# Patient Record
Sex: Female | Born: 2000
Health system: Southern US, Community
[De-identification: ages and names within clinical notes are randomized; demographics above are authoritative.]

---
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
## 2018-02-23 DIAGNOSIS — Z91038 Other insect allergy status: Secondary | ICD-10-CM | POA: Diagnosis not present

## 2018-03-31 DIAGNOSIS — Z0000 Encounter for general adult medical examination without abnormal findings: Secondary | ICD-10-CM | POA: Diagnosis not present

## 2018-03-31 DIAGNOSIS — Z0100 Encounter for examination of eyes and vision without abnormal findings: Secondary | ICD-10-CM | POA: Diagnosis not present

## 2018-03-31 DIAGNOSIS — Z0110 Encounter for examination of ears and hearing without abnormal findings: Secondary | ICD-10-CM | POA: Diagnosis not present

## 2019-02-13 ENCOUNTER — Encounter (HOSPITAL_BASED_OUTPATIENT_CLINIC_OR_DEPARTMENT_OTHER): Payer: Self-pay | Admitting: *Deleted

## 2019-02-13 ENCOUNTER — Emergency Department (HOSPITAL_BASED_OUTPATIENT_CLINIC_OR_DEPARTMENT_OTHER)
Admission: EM | Admit: 2019-02-13 | Discharge: 2019-02-13 | Disposition: A | Discharge status: Home / Self Care | Payer: 59 | Attending: Emergency Medicine | Admitting: Emergency Medicine

## 2019-02-13 ENCOUNTER — Other Ambulatory Visit: Payer: Self-pay

## 2019-02-13 DIAGNOSIS — O99891 Other specified diseases and conditions complicating pregnancy: Secondary | ICD-10-CM | POA: Insufficient documentation

## 2019-02-13 DIAGNOSIS — F17210 Nicotine dependence, cigarettes, uncomplicated: Secondary | ICD-10-CM | POA: Insufficient documentation

## 2019-02-13 DIAGNOSIS — R1011 Right upper quadrant pain: Secondary | ICD-10-CM | POA: Diagnosis not present

## 2019-02-13 DIAGNOSIS — R1013 Epigastric pain: Secondary | ICD-10-CM | POA: Insufficient documentation

## 2019-02-13 DIAGNOSIS — R110 Nausea: Secondary | ICD-10-CM | POA: Insufficient documentation

## 2019-02-13 DIAGNOSIS — Z3A01 Less than 8 weeks gestation of pregnancy: Secondary | ICD-10-CM | POA: Insufficient documentation

## 2019-02-13 DIAGNOSIS — R1012 Left upper quadrant pain: Secondary | ICD-10-CM | POA: Diagnosis not present

## 2019-02-13 LAB — URINALYSIS, ROUTINE W REFLEX MICROSCOPIC
Bilirubin Urine: NEGATIVE
Glucose, UA: NEGATIVE mg/dL
Hgb urine dipstick: NEGATIVE
Ketones, ur: NEGATIVE mg/dL
Leukocytes,Ua: NEGATIVE
Nitrite: NEGATIVE
Protein, ur: NEGATIVE mg/dL
Specific Gravity, Urine: 1.02 (ref 1.005–1.030)
pH: 6 (ref 5.0–8.0)

## 2019-02-13 LAB — COMPREHENSIVE METABOLIC PANEL
ALT: 26 U/L (ref 0–44)
AST: 23 U/L (ref 15–41)
Albumin: 4.3 g/dL (ref 3.5–5.0)
Alkaline Phosphatase: 49 U/L (ref 38–126)
Anion gap: 7 (ref 5–15)
BUN: 9 mg/dL (ref 6–20)
CO2: 26 mmol/L (ref 22–32)
Calcium: 9.7 mg/dL (ref 8.9–10.3)
Chloride: 102 mmol/L (ref 98–111)
Creatinine, Ser: 0.53 mg/dL (ref 0.44–1.00)
GFR calc Af Amer: >60 mL/min (ref >60)
GFR calc non Af Amer: >60 mL/min (ref >60)
Glucose, Bld: 88 mg/dL (ref 70–99)
Potassium: 4.1 mmol/L (ref 3.5–5.1)
Sodium: 135 mmol/L (ref 135–145)
Total Bilirubin: 1.1 mg/dL (ref 0.3–1.2)
Total Protein: 7.6 g/dL (ref 6.5–8.1)

## 2019-02-13 LAB — CBC WITH DIFFERENTIAL/PLATELET
Abs Immature Granulocytes: 0.03 10*3/uL (ref 0.00–0.07)
Basophils Absolute: 0 10*3/uL (ref 0.0–0.1)
Basophils Relative: 1 %
Eosinophils Absolute: 0.2 10*3/uL (ref 0.0–0.5)
Eosinophils Relative: 3 %
HCT: 38.1 % (ref 36.0–46.0)
Hemoglobin: 12.4 g/dL (ref 12.0–15.0)
Immature Granulocytes: 1 %
Lymphocytes Relative: 29 %
Lymphs Abs: 1.8 10*3/uL (ref 0.7–4.0)
MCH: 31.2 pg (ref 26.0–34.0)
MCHC: 32.5 g/dL (ref 30.0–36.0)
MCV: 95.7 fL (ref 80.0–100.0)
Monocytes Absolute: 0.5 10*3/uL (ref 0.1–1.0)
Monocytes Relative: 9 %
Neutro Abs: 3.6 10*3/uL (ref 1.7–7.7)
Neutrophils Relative %: 57 %
Platelets: 218 10*3/uL (ref 150–400)
RBC: 3.98 MIL/uL (ref 3.87–5.11)
RDW: 12.7 % (ref 11.5–15.5)
WBC: 6.1 10*3/uL (ref 4.0–10.5)
nRBC: 0 % (ref 0.0–0.2)

## 2019-02-13 LAB — LIPASE, BLOOD: Lipase: 20 U/L (ref 11–51)

## 2019-02-13 LAB — PREGNANCY, URINE: Preg Test, Ur: POSITIVE — AB

## 2019-02-13 MED ORDER — ONDANSETRON 4 MG PO TBDP
ORAL_TABLET | ORAL | Status: AC
  Filled 2019-02-13: qty 1

## 2019-02-13 MED ORDER — ONDANSETRON 4 MG PO TBDP
4.0000 mg | ORAL_TABLET | Freq: Once | ORAL | Status: AC
  Administered 2019-02-13: 11:00:00 4 mg via ORAL

## 2019-02-13 NOTE — ED Notes (Signed)
ED Provider at bedside. 

## 2019-02-13 NOTE — Discharge Instructions (Addendum)
You can take a 1/2 tab of unisom (doxylamine) at bedtime with 50-100mg  of vitamin B6 (pyroxidine). This may help your morning sickness/nausea. It is important you drink plenty of water throughout the day. Eat smaller more frequent meals to help with nausea. Begin taking a prenatal vitamin daily. You can also find this over-the-counter. Establish OB care.  Report to the Maternity Admissions Unit (MAU) at [facility] if you develop lower abdominal pain, with vaginal bleeding, uncontrollable vomiting, or other concerning symptoms.

## 2019-02-13 NOTE — ED Triage Notes (Signed)
Abdominal pain for a week.  Last menstrual period was Christmas.  Denies nausea.

## 2019-02-13 NOTE — ED Provider Notes (Signed)
[facility] EMERGENCY DEPARTMENT Provider Note   CSN: [id number] Arrival date & time: 02/13/19  1032     History Chief Complaint  Patient presents with  . Abdominal Pain    Sonya Ewing is a 19 y.o. female, presenting to the ED with complaint of b/l upper abd pain that began 1 week ago. Pain is described as intermittent and sharp. Pain tends to occur prior to having a BM, which have been loose. She reports frequent nausea, no vomiting. She has been able to tolerate fluids and meals later in the day. Symptoms seem worse in the morning. Denies vaginal d/c, vaginal bleeding, F/C, urinary sx. She is sexually active with female partners, has not been using protection recently. LMP Jan 06, 2019. Menstrual cycle is normally regular, considers this menstrual period late. No medications tried for symptoms.   The history is provided by the patient.       History reviewed. No pertinent past medical history.  There are no problems to display for this patient.   History reviewed. No pertinent surgical history.   OB History    Gravida  1   Para      Term      Preterm      AB      Living        SAB      TAB      Ectopic      Multiple      Live Births              No family history on file.  Social History   Tobacco Use  . Smoking status: Current Every Day Smoker    Types: Cigars  . Smokeless tobacco: Never Used  . Tobacco comment: Black and milds  Substance Use Topics  . Alcohol use: No  . Drug use: Yes    Types: Marijuana    Comment: month ago    Home Medications Prior to Admission medications   Not on File    Allergies    Patient has no known allergies.  Review of Systems   Review of Systems  Gastrointestinal: Positive for abdominal pain, diarrhea and nausea. Negative for vomiting.  All other systems reviewed and are negative.   Physical Exam Updated Vital Signs BP 113/76 (BP Location: Left Arm)   Pulse 73   Temp 98.7 F (37.1 C)  (Oral)   Resp 16   Ht 5\' 7"  (1.702 m)   Wt 52.6 kg   LMP 01/06/2019   SpO2 100%   BMI 18.17 kg/m   Physical Exam Vitals and nursing note reviewed.  Constitutional:      General: She is not in acute distress.    Appearance: She is well-developed. She is not ill-appearing.  HENT:     Head: Normocephalic and atraumatic.  Eyes:     Conjunctiva/sclera: Conjunctivae normal.  Cardiovascular:     Rate and Rhythm: Normal rate and regular rhythm.  Pulmonary:     Effort: Pulmonary effort is normal. No respiratory distress.     Breath sounds: Normal breath sounds.  Abdominal:     General: Abdomen is flat. Bowel sounds are normal.     Palpations: Abdomen is soft.     Tenderness: There is abdominal tenderness in the right upper quadrant, epigastric area and left upper quadrant. There is no guarding or rebound.  Skin:    General: Skin is warm.  Neurological:     Mental Status: She is alert.  Psychiatric:        Behavior: Behavior normal.     ED Results / Procedures / Treatments   Labs (all labs ordered are listed, but only abnormal results are displayed) Labs Reviewed  URINALYSIS, ROUTINE W REFLEX MICROSCOPIC - Abnormal; Notable for the following components:      Result Value   APPearance CLOUDY (*)    All other components within normal limits  PREGNANCY, URINE - Abnormal; Notable for the following components:   Preg Test, Ur POSITIVE (*)    All other components within normal limits  COMPREHENSIVE METABOLIC PANEL  LIPASE, BLOOD  CBC WITH DIFFERENTIAL/PLATELET    EKG None  Radiology No results found.  Procedures Procedures (including critical care time)  Medications Ordered in ED Medications  ondansetron (ZOFRAN-ODT) disintegrating tablet 4 mg (4 mg Oral Given 02/13/19 1057)    ED Course  I have reviewed the triage vital signs and the nursing notes.  Pertinent labs & imaging results that were available during my care of the patient were reviewed by me and considered  in my medical decision making (see chart for details).    MDM Rules/Calculators/A&P                      Patient presenting with 1 week of intermittent upper abdominal pain with associated nausea and diarrhea.  She is also late for her menstrual period, LMP 01/06/2019.  She is sexually active with female partners without protection though is not having any pelvic complaints today.  Including, no vaginal bleeding, vaginal discharge, or pelvic pain.  Her abdomen is soft with some mild tenderness in the upper quadrants though no guarding or rebound.  Vital signs are normal.  She is tolerating p.o. fluids and foods.  Urine pregnancy test is positive.  Lab work is otherwise unremarkable.  Presentation is not consistent with ectopic pregnancy.  Do not believe emergent ultrasound is indicated at this time.  Discussed symptomatic management of nausea to include Unisom and B6, instructed she begin taking a prenatal vitamin.  We will also provide referral to establish OB care.  Patient is agreeable with this plan.  Will discharge with symptomatic management, strict return precautions.  Patient verbalized understanding agrees with care plan for discharge.  Patient discussed with Dr. Criss Alvine, who agrees with work-up and care plan.  Discussed results, findings, treatment and follow up. Patient advised of return precautions. Patient verbalized understanding and agreed with plan.  Final Clinical Impression(s) / ED Diagnoses Final diagnoses:  Less than [redacted] weeks gestation of pregnancy    Rx / DC Orders ED Discharge Orders    None       Markus Casten, [place], PA-C 02/13/19 1233    Pricilla Loveless, MD 02/15/19 [phone]

## 2019-02-18 ENCOUNTER — Encounter (HOSPITAL_BASED_OUTPATIENT_CLINIC_OR_DEPARTMENT_OTHER): Payer: Self-pay | Admitting: Emergency Medicine

## 2019-02-18 ENCOUNTER — Emergency Department (HOSPITAL_BASED_OUTPATIENT_CLINIC_OR_DEPARTMENT_OTHER): Payer: 59

## 2019-02-18 ENCOUNTER — Emergency Department (HOSPITAL_BASED_OUTPATIENT_CLINIC_OR_DEPARTMENT_OTHER)
Admission: EM | Admit: 2019-02-18 | Discharge: 2019-02-18 | Disposition: A | Discharge status: Home / Self Care | Payer: 59 | Attending: Emergency Medicine | Admitting: Emergency Medicine

## 2019-02-18 ENCOUNTER — Other Ambulatory Visit: Payer: Self-pay

## 2019-02-18 DIAGNOSIS — F17290 Nicotine dependence, other tobacco product, uncomplicated: Secondary | ICD-10-CM | POA: Diagnosis not present

## 2019-02-18 DIAGNOSIS — O99330 Smoking (tobacco) complicating pregnancy, unspecified trimester: Secondary | ICD-10-CM | POA: Insufficient documentation

## 2019-02-18 DIAGNOSIS — R1030 Lower abdominal pain, unspecified: Secondary | ICD-10-CM | POA: Insufficient documentation

## 2019-02-18 DIAGNOSIS — R1013 Epigastric pain: Secondary | ICD-10-CM | POA: Diagnosis not present

## 2019-02-18 DIAGNOSIS — O99891 Other specified diseases and conditions complicating pregnancy: Secondary | ICD-10-CM | POA: Diagnosis not present

## 2019-02-18 DIAGNOSIS — Z3A01 Less than 8 weeks gestation of pregnancy: Secondary | ICD-10-CM | POA: Diagnosis not present

## 2019-02-18 DIAGNOSIS — R109 Unspecified abdominal pain: Secondary | ICD-10-CM | POA: Diagnosis present

## 2019-02-18 LAB — CBC WITH DIFFERENTIAL/PLATELET
Abs Immature Granulocytes: 0.02 10*3/uL (ref 0.00–0.07)
Basophils Absolute: 0.1 10*3/uL (ref 0.0–0.1)
Basophils Relative: 1 %
Eosinophils Absolute: 0.2 10*3/uL (ref 0.0–0.5)
Eosinophils Relative: 3 %
HCT: 35.6 % — ABNORMAL LOW (ref 36.0–46.0)
Hemoglobin: 11.5 g/dL — ABNORMAL LOW (ref 12.0–15.0)
Immature Granulocytes: 0 %
Lymphocytes Relative: 35 %
Lymphs Abs: 2.1 10*3/uL (ref 0.7–4.0)
MCH: 31.2 pg (ref 26.0–34.0)
MCHC: 32.3 g/dL (ref 30.0–36.0)
MCV: 96.5 fL (ref 80.0–100.0)
Monocytes Absolute: 0.7 10*3/uL (ref 0.1–1.0)
Monocytes Relative: 11 %
Neutro Abs: 3 10*3/uL (ref 1.7–7.7)
Neutrophils Relative %: 50 %
Platelets: 217 10*3/uL (ref 150–400)
RBC: 3.69 MIL/uL — ABNORMAL LOW (ref 3.87–5.11)
RDW: 12.6 % (ref 11.5–15.5)
WBC: 6 10*3/uL (ref 4.0–10.5)
nRBC: 0 % (ref 0.0–0.2)

## 2019-02-18 LAB — HCG, QUANTITATIVE, PREGNANCY: hCG, Beta Chain, Quant, S: 83098 m[IU]/mL — ABNORMAL HIGH (ref <5)

## 2019-02-18 LAB — URINALYSIS, ROUTINE W REFLEX MICROSCOPIC
Bilirubin Urine: NEGATIVE
Glucose, UA: NEGATIVE mg/dL
Hgb urine dipstick: NEGATIVE
Ketones, ur: NEGATIVE mg/dL
Leukocytes,Ua: NEGATIVE
Nitrite: NEGATIVE
Protein, ur: NEGATIVE mg/dL
Specific Gravity, Urine: 1.025 (ref 1.005–1.030)
pH: 6.5 (ref 5.0–8.0)

## 2019-02-18 LAB — COMPREHENSIVE METABOLIC PANEL
ALT: 40 U/L (ref 0–44)
AST: 34 U/L (ref 15–41)
Albumin: 4 g/dL (ref 3.5–5.0)
Alkaline Phosphatase: 47 U/L (ref 38–126)
Anion gap: 9 (ref 5–15)
BUN: 9 mg/dL (ref 6–20)
CO2: 22 mmol/L (ref 22–32)
Calcium: 8.8 mg/dL — ABNORMAL LOW (ref 8.9–10.3)
Chloride: 102 mmol/L (ref 98–111)
Creatinine, Ser: 0.55 mg/dL (ref 0.44–1.00)
GFR calc Af Amer: >60 mL/min (ref >60)
GFR calc non Af Amer: >60 mL/min (ref >60)
Glucose, Bld: 88 mg/dL (ref 70–99)
Potassium: 3.8 mmol/L (ref 3.5–5.1)
Sodium: 133 mmol/L — ABNORMAL LOW (ref 135–145)
Total Bilirubin: 0.9 mg/dL (ref 0.3–1.2)
Total Protein: 7.2 g/dL (ref 6.5–8.1)

## 2019-02-18 MED ORDER — METOCLOPRAMIDE HCL 5 MG/ML IJ SOLN
10.0000 mg | Freq: Once | INTRAMUSCULAR | Status: AC
  Administered 2019-02-18: 10 mg via INTRAVENOUS
  Filled 2019-02-18: qty 2

## 2019-02-18 MED ORDER — DIPHENHYDRAMINE HCL 50 MG/ML IJ SOLN
12.5000 mg | Freq: Once | INTRAMUSCULAR | Status: AC
  Administered 2019-02-18: 12.5 mg via INTRAVENOUS
  Filled 2019-02-18: qty 1

## 2019-02-18 MED ORDER — ACETAMINOPHEN 325 MG PO TABS
650.0000 mg | ORAL_TABLET | Freq: Once | ORAL | Status: AC
  Administered 2019-02-18: 18:00:00 650 mg via ORAL
  Filled 2019-02-18: qty 2

## 2019-02-18 NOTE — ED Notes (Signed)
Patient transported to Ultrasound 

## 2019-02-18 NOTE — ED Triage Notes (Signed)
Pt reports abdominal pain that pt reports started this morning. Also reports N/V. Pt report pregnant, unknown last menses- Christmas?

## 2019-02-18 NOTE — Discharge Instructions (Signed)
Your lab work looks okay today.   You had an ultrasound of your pelvis which is a pregnancy in the uterus at approximately 6 weeks and 5 days long.  You had a ultrasound of your right upper quadrant which did not show any gallstones but does show a small amount of fluid in your abdomen or possibly around your heart.  You need to call your doctor on Monday and have them review the results of your ultrasounds.  You could need further evaluation of your heart.  In the meantime, if you develop any chest pain, shortness of breath, lightheadedness, or passing out you need to return immediately to the emergency department.

## 2019-02-18 NOTE — ED Notes (Signed)
PT states " I am hungry and I want my IV out and I want to go home". Josh PA informed pt wants to leave. This RN informed pt he would be in as soon as possible, but could not give a time frame. Pt getting dressed and stated " When my boyfriend get here I am leaving".

## 2019-02-18 NOTE — ED Provider Notes (Signed)
[facility] EMERGENCY DEPARTMENT Provider Note   CSN: [id number] Arrival date & time: 02/18/19  1552     History Chief Complaint  Patient presents with  . Abdominal Pain    Sonya Ewing is a 19 y.o. female G1P0, presenting to the ED for abdominal pain.  Patient was recently evaluated on 02/13/2019 for epigastric abdominal pain and missed menstrual period.  Patient was found to be pregnant via pregnancy test.  Her abdominal pain was all epigastric and she had normal lab work.  Discharged with symptomatic management, OB referral, and return precautions.  She returns today with worsening pain she states is now going towards her lower abdomen and towards the right.  She reports that sharp in nature with intermittent cramping radiating to her low back.  She reports persistent nausea without vomiting.  No fevers or chills.  Denies vaginal bleeding or discharge, denies urinary symptoms.  She has not treated her symptoms with any medications.  The history is provided by the patient and medical records.       History reviewed. No pertinent past medical history.  There are no problems to display for this patient.   History reviewed. No pertinent surgical history.   OB History    Gravida  1   Para      Term      Preterm      AB      Living        SAB      TAB      Ectopic      Multiple      Live Births              History reviewed. No pertinent family history.  Social History   Tobacco Use  . Smoking status: Current Every Day Smoker    Types: Cigars  . Smokeless tobacco: Never Used  . Tobacco comment: Black and milds  Substance Use Topics  . Alcohol use: No  . Drug use: Yes    Types: Marijuana    Comment: month ago    Home Medications Prior to Admission medications   Not on File    Allergies    Patient has no known allergies.  Review of Systems   Review of Systems  All other systems reviewed and are negative.   Physical Exam  Updated Vital Signs BP 112/85 (BP Location: Right Arm)   Pulse 69   Temp 98.2 F (36.8 C) (Oral)   Resp (!) 24   LMP 01/03/2019   SpO2 100%   Physical Exam Vitals and nursing note reviewed.  Constitutional:      General: She is not in acute distress.    Appearance: She is well-developed. She is not ill-appearing.  HENT:     Head: Normocephalic and atraumatic.  Eyes:     Conjunctiva/sclera: Conjunctivae normal.  Cardiovascular:     Rate and Rhythm: Normal rate and regular rhythm.  Pulmonary:     Effort: Pulmonary effort is normal. No respiratory distress.     Breath sounds: Normal breath sounds.  Abdominal:     General: Abdomen is flat. Bowel sounds are normal.     Palpations: Abdomen is soft.     Tenderness: There is abdominal tenderness in the right lower quadrant, periumbilical area and suprapubic area. There is no guarding or rebound.  Skin:    General: Skin is warm.  Neurological:     Mental Status: She is alert.  Psychiatric:  Behavior: Behavior normal.     ED Results / Procedures / Treatments   Labs (all labs ordered are listed, but only abnormal results are displayed) Labs Reviewed  URINALYSIS, ROUTINE W REFLEX MICROSCOPIC - Abnormal; Notable for the following components:      Result Value   APPearance HAZY (*)    All other components within normal limits  COMPREHENSIVE METABOLIC PANEL - Abnormal; Notable for the following components:   Sodium 133 (*)    Calcium 8.8 (*)    All other components within normal limits  CBC WITH DIFFERENTIAL/PLATELET - Abnormal; Notable for the following components:   RBC 3.69 (*)    Hemoglobin 11.5 (*)    HCT 35.6 (*)    All other components within normal limits  HCG, QUANTITATIVE, PREGNANCY - Abnormal; Notable for the following components:   hCG, Beta Chain, Quant, S 83,098 (*)    All other components within normal limits    EKG None  Radiology US OB Comp < 14 Wks  Result Date: 02/18/2019 CLINICAL DATA:   Epigastric, right lower quadrant pain EXAM: OBSTETRIC <14 WK [place] AND TRANSVAGINAL OB US TECHNIQUE: Both transabdominal and transvaginal ultrasound examinations were performed for complete evaluation of the gestation as well as the maternal uterus, adnexal regions, and pelvic cul-de-sac. Transvaginal technique was performed to assess early pregnancy. COMPARISON:  None. FINDINGS: Intrauterine gestational sac: Single Yolk sac:  Visualized Embryo:  Visualized Cardiac Activity: Visualized Heart Rate: 120 bpm MSD:   mm    w     d CRL:  7.9 mm   6 w   5 d                  [place] EDC: 10/09/2019 Subchorionic hemorrhage:  None visualized. Maternal uterus/adnexae: No adnexal mass or free fluid. IMPRESSION: Six week 5 day intrauterine pregnancy. Fetal heart rate 120 beats per minute. No acute maternal findings. Electronically Signed   By: Charlett Nose M.D.   On: 02/18/2019 17:36   US OB Transvaginal  Result Date: 02/18/2019 CLINICAL DATA:  Epigastric, right lower quadrant pain EXAM: OBSTETRIC <14 WK [place] AND TRANSVAGINAL OB US TECHNIQUE: Both transabdominal and transvaginal ultrasound examinations were performed for complete evaluation of the gestation as well as the maternal uterus, adnexal regions, and pelvic cul-de-sac. Transvaginal technique was performed to assess early pregnancy. COMPARISON:  None. FINDINGS: Intrauterine gestational sac: Single Yolk sac:  Visualized Embryo:  Visualized Cardiac Activity: Visualized Heart Rate: 120 bpm MSD:   mm    w     d CRL:  7.9 mm   6 w   5 d                  [place] EDC: 10/09/2019 Subchorionic hemorrhage:  None visualized. Maternal uterus/adnexae: No adnexal mass or free fluid. IMPRESSION: Six week 5 day intrauterine pregnancy. Fetal heart rate 120 beats per minute. No acute maternal findings. Electronically Signed   By: Charlett Nose M.D.   On: 02/18/2019 17:36    Procedures Procedures (including critical care time)  Medications Ordered in ED Medications  acetaminophen (TYLENOL)  tablet 650 mg (650 mg Oral Given 02/18/19 1751)  metoCLOPramide (REGLAN) injection 10 mg (10 mg Intravenous Given 02/18/19 1634)  diphenhydrAMINE (BENADRYL) injection 12.5 mg (12.5 mg Intravenous Given 02/18/19 1635)    ED Course  I have reviewed the triage vital signs and the nursing notes.  Pertinent labs & imaging results that were available during my care of the patient were reviewed by  me and considered in my medical decision making (see chart for details).  Clinical Course as of Feb 17 1750  Sat Feb 18, 2019  1751 Repeat abdominal exam now with RUQ tenderness as well. Shared decision making regarding symptomatic managemnt vs RUQ u/s, pt would prefer u/s. Care assumed at shift change by Daisy Lazar.   [JR]    Clinical Course User Index [JR] Yulanda Diggs, [place], PA-C   MDM Rules/Calculators/A&P                      Patient is G1, P0, with recent positive pregnancy test on 02/13/2019 in the ED, LMP 01/06/2019.  Patient presenting with worsening abdominal pain, initially was in the epigastric region during visit 5 days ago, however reports is moving lower in her abdomen may be favoring the right side.  She reports persistent nausea.  Denies vaginal bleeding or discharge.  Denies urinary symptoms.  Abdomen is soft with some tenderness the lower abdomen and periumbilical region.  No guarding or rebound.  Vital signs are stable.  Will repeat lab work, obtain quantitative hCG, ABO Rh, and pelvic ultrasound for further evaluation.  Pelvic U/S showing live intrauterine pregnancy estimated gestational age of [redacted] weeks and 5 days.  On repeat abdominal exam, she now has right upper quadrant tenderness and thinks her symptoms may be worse after meals as well.  Discussed reassuring lab work including normal LFTs.  Discussed symptomatic management and outpatient follow-up versus ultrasound of her gallbladder here in the ED.  Patient would prefer ultrasound.  Imaging ordered.  Patient care assumed at shift change  by PA Geiple.  If negative, recommend symptomatic management including antacids, p.o. Tylenol for pain, PCP follow-up.  Final Clinical Impression(s) / ED Diagnoses Final diagnoses:  Epigastric abdominal pain    Rx / DC Orders ED Discharge Orders    None       Eowyn Tabone, [place], PA-C 02/18/19 1753    Rolan Bucco, MD 02/18/19 1818

## 2019-02-18 NOTE — ED Provider Notes (Signed)
8:46 PM signout from [employer] at shift change.  Patient who is in first trimester pregnancy presents with continued abdominal pain.  She has PCP and planned OB/GYN follow-up.  She had a pelvic exam which did not have any evidence of an infection.  She had a pelvic ultrasound which shows intrauterine pregnancy without any complications.  Patient then had some right upper quadrant pain and ultrasound was ordered to evaluate for gallstones.  No gallstones.  There is a thin stripe of fluid, question intra-abdominal versus pericardial.   I went to see the patient.  She is very anxious and trying to leave because she is hungry.  Patient is stressed.  She was actually walking out the door when I approached the room.  Patient returned to the room and we had a discussion about her results today.  Discussed that we do not see any signs of gallstones or other problems with the gallbladder which would explain her pain.  We discussed that she has some fluid and we cannot tell at this point whether is in the abdomen or around the heart.  Patient denies any significant shortness of breath or chest pain.  She has had no recent coronavirus or other viral illnesses.  Patient is adamant about leaving.  I asked her to call her primary care doctor and discussed the results of her ultrasound on Monday (today is Saturday).  She states that she is willing to do this.  I discussed that if in the meantime she develops any chest pain, shortness of breath, fevers, cough or worsening of her current symptoms that she should return the emergency department.  She agrees with plan.  Discussed that I cannot currently tell how significant the fluid is but the amount is currently small.   I took the time to print out discharge instructions for the patient which she accepted.  Left in no distress, no shortness of breath or any respiratory distress.  BP 117/67 (BP Location: Left Arm)   Pulse 67   Temp 98.2 F (36.8 C) (Oral)   Resp 16    LMP 01/03/2019   SpO2 100%     Renne Crigler, PA-C 02/18/19 2109    Rolan Bucco, MD 02/18/19 2205

## 2019-06-20 IMAGING — CT CT ABD-PELV W/ CM
2 of 4 series · 16 of 46 positions shown, 18 images · IV contrast (iopamidol)
Comparison: None.

CLINICAL DATA: Right lower quadrant abdominal pain for 1 day.

EXAM:
CT ABDOMEN AND PELVIS WITH CONTRAST
TECHNIQUE: Multidetector CT imaging of the abdomen and pelvis was performed
using the standard protocol following bolus administration of
intravenous contrast.
CONTRAST:  100mL [id number] IOPAMIDOL ([id number]) INJECTION 61%

[Series 2: axial st · axial · 0.62mm/px · z∈[-454,-59]mm · 13 of 87 slices shown, 15 images]
[im 4/87  soft-tissue]
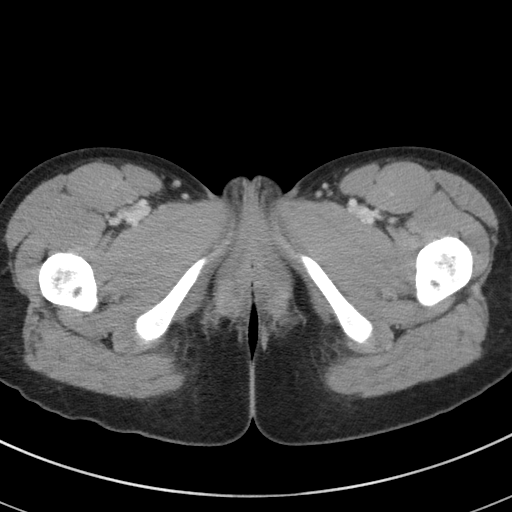
[im 4/87  bone]
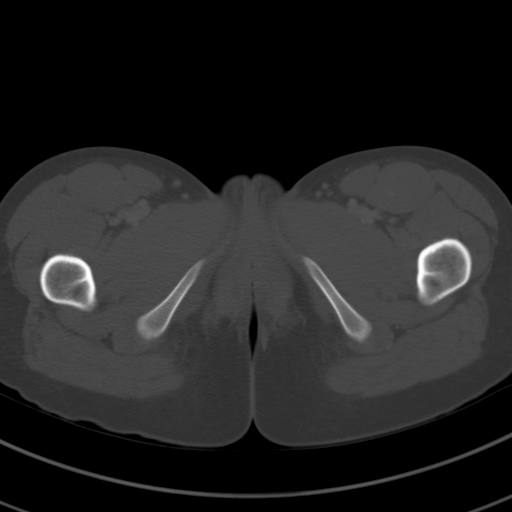
[im 10/87  soft-tissue]
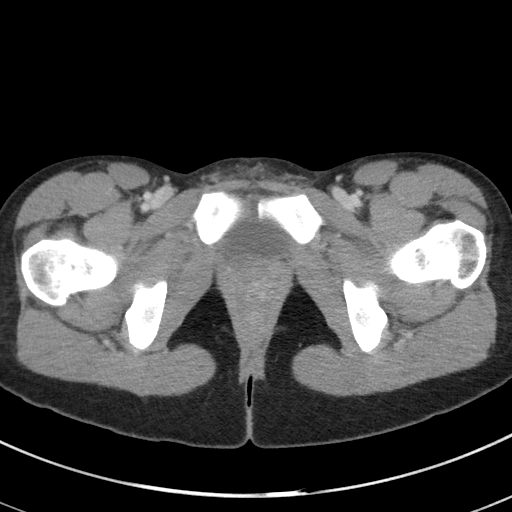
[im 17/87  soft-tissue]
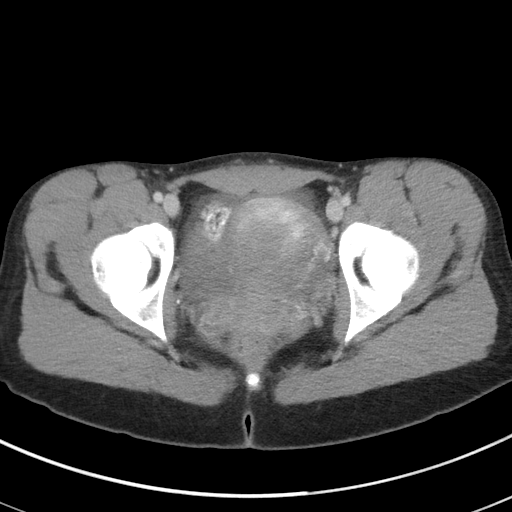
[im 24/87  soft-tissue]
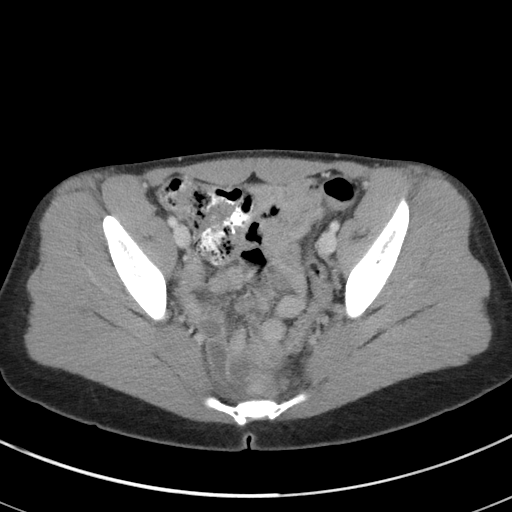
[im 30/87  soft-tissue]
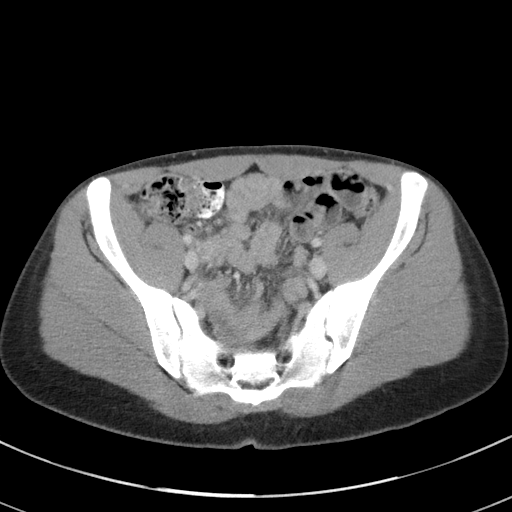
[im 37/87  soft-tissue]
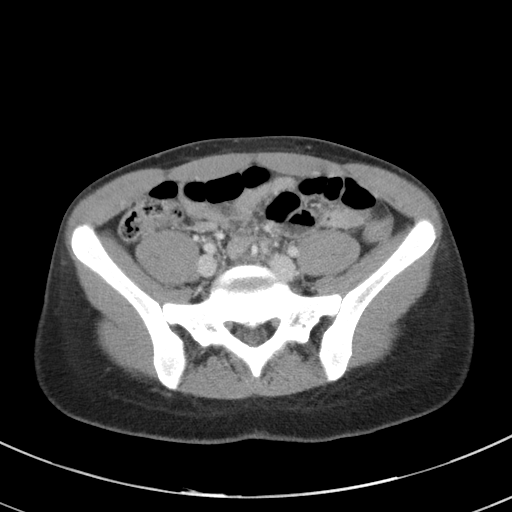
[im 44/87  soft-tissue]
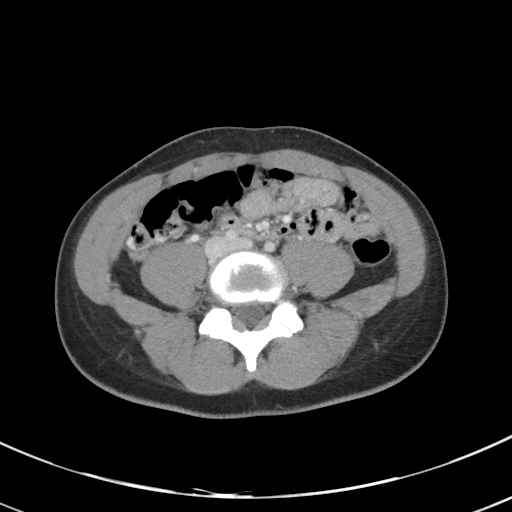
[im 50/87  soft-tissue]
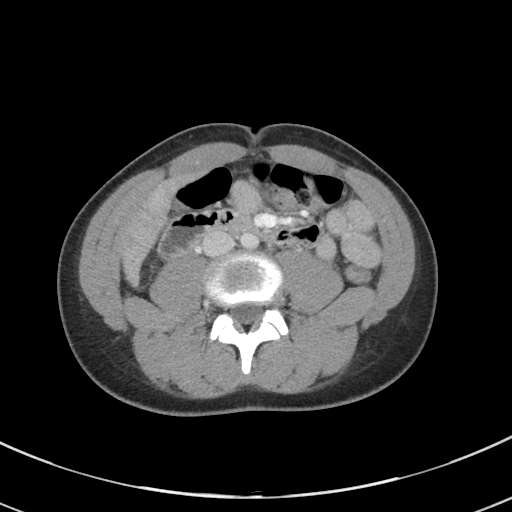
[im 57/87  soft-tissue]
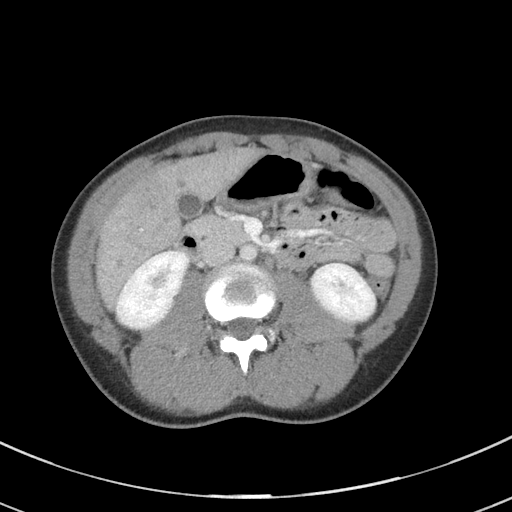
[im 57/87  bone]
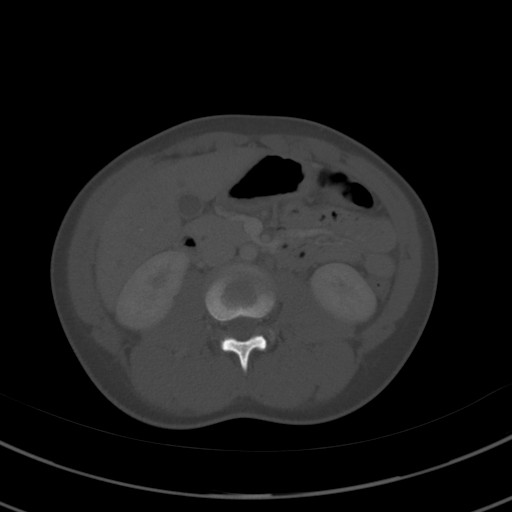
[im 63/87  soft-tissue]
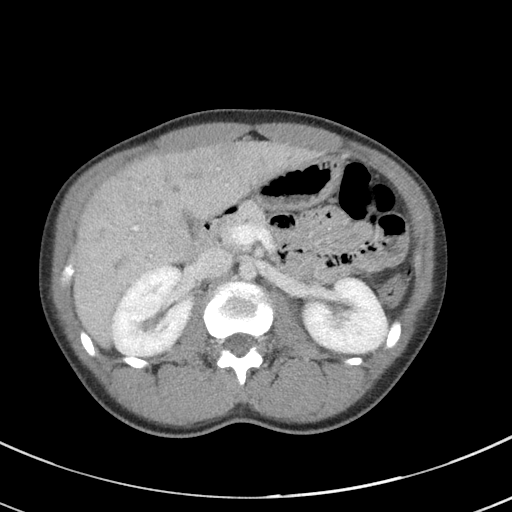
[im 70/87  soft-tissue]
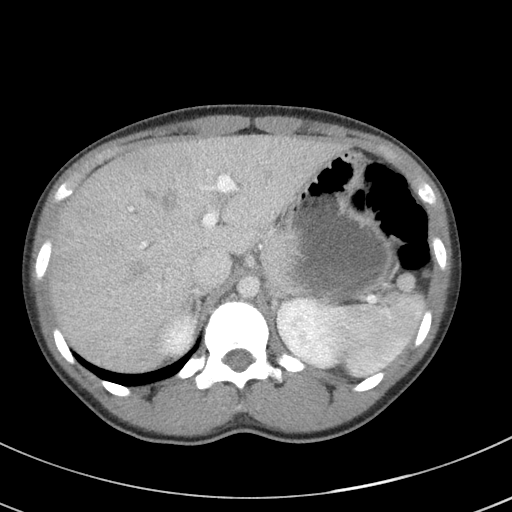
[im 77/87  soft-tissue]
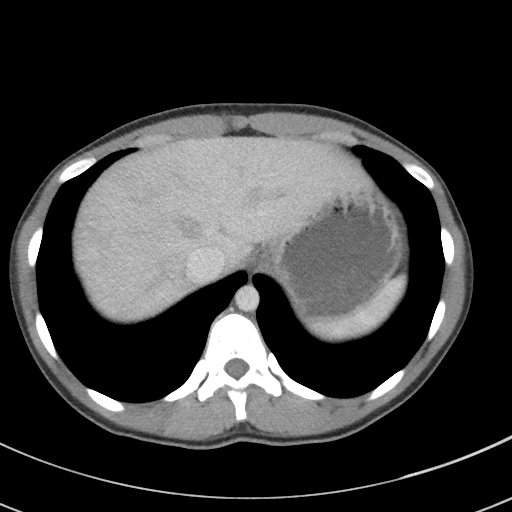
[im 83/87  soft-tissue]
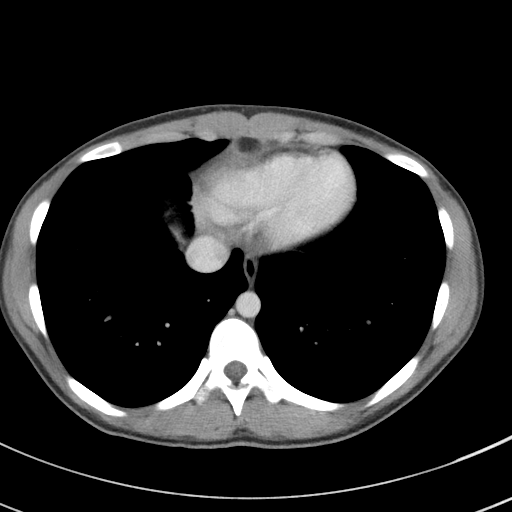

[Series 5: coronal st · coronal · 0.70mm/px · 3 of 70 slices shown]
[im 24/70  soft-tissue]
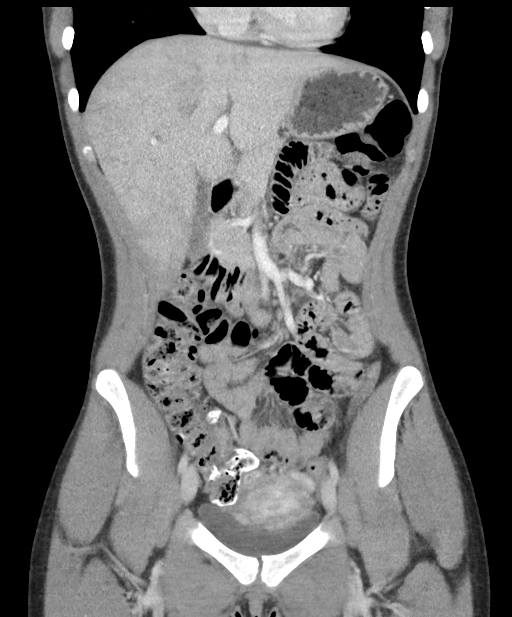
[im 31/70  soft-tissue]
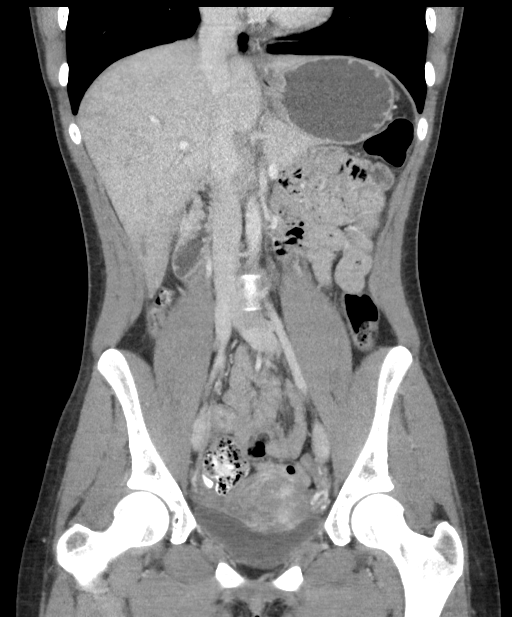
[im 39/70  soft-tissue]
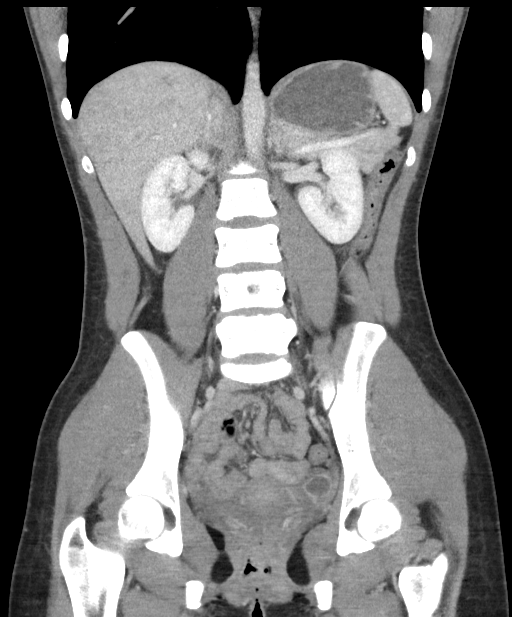

[16 of 46 positions shown; findings below may reference images not displayed]

FINDINGS: Lower chest: No acute findings at the lung bases. 3 mm subpleural
nodule in the lingula is probably a lymph node. No pleural effusion.
The heart is normal in size.

Hepatobiliary: No focal hepatic lesions or intrahepatic biliary
dilatation. The gallbladder is normal. No common bile duct
dilatation.

Pancreas: No mass, inflammation or ductal dilatation.

Spleen: Normal size.  No focal lesions.

Adrenals/Urinary Tract: The adrenal glands are normal.

Both kidneys are normal. No renal calculi or findings for
pyelonephritis. The bladder is normal.

Stomach/Bowel: The stomach, duodenum, small bowel and colon are
grossly normal. No acute inflammatory changes, mass lesions or
obstructive findings. The terminal ileum is normal. The appendix is
normal.

Vascular/Lymphatic: The aorta and branch vessels are normal. The
major venous structures are normal.

No mesenteric or retroperitoneal mass or adenopathy. Small scattered
lymph nodes are noted.

Reproductive: The uterus and ovaries are unremarkable.

Other: Small amount of free pelvic fluid is likely physiologic.

Musculoskeletal: No significant bony findings.
IMPRESSION: 1. No acute abdominal/pelvic findings, mass lesions or adenopathy.
The appendix and right ovary appear normal.
2. No renal, ureteral or bladder calculi evidence of pyelonephritis.

## 2020-10-01 IMAGING — US US OB COMP LESS 14 WK
1 series · 14 of 28 positions shown · non-contrast
Comparison: None.

CLINICAL DATA: Epigastric, right lower quadrant pain

EXAM:
OBSTETRIC <14 WK US AND TRANSVAGINAL OB US
TECHNIQUE: Both transabdominal and transvaginal ultrasound examinations were
performed for complete evaluation of the gestation as well as the
maternal uterus, adnexal regions, and pelvic cul-de-sac.
Transvaginal technique was performed to assess early pregnancy.

[Series 1: us ob comp less 14 wk · 14 of 67 slices shown]
[im 3/67]
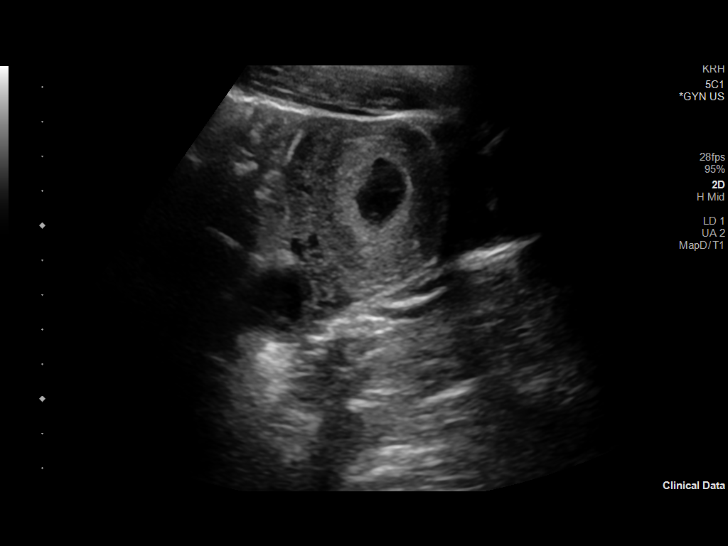
[im 8/67]
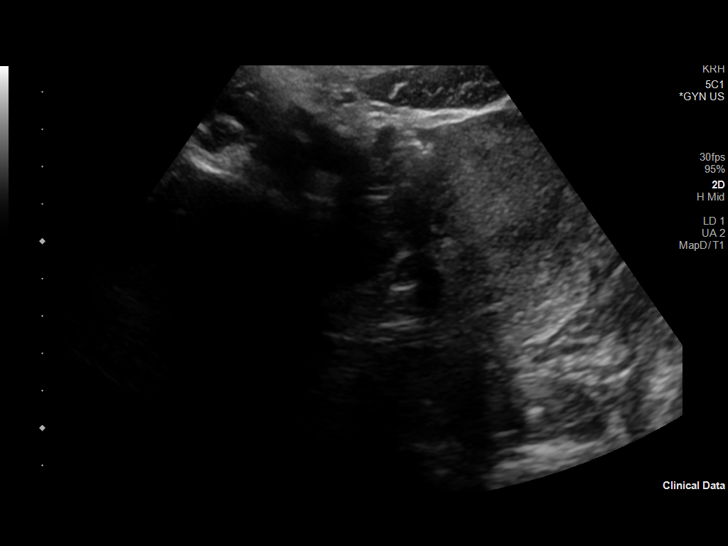
[im 13/67]
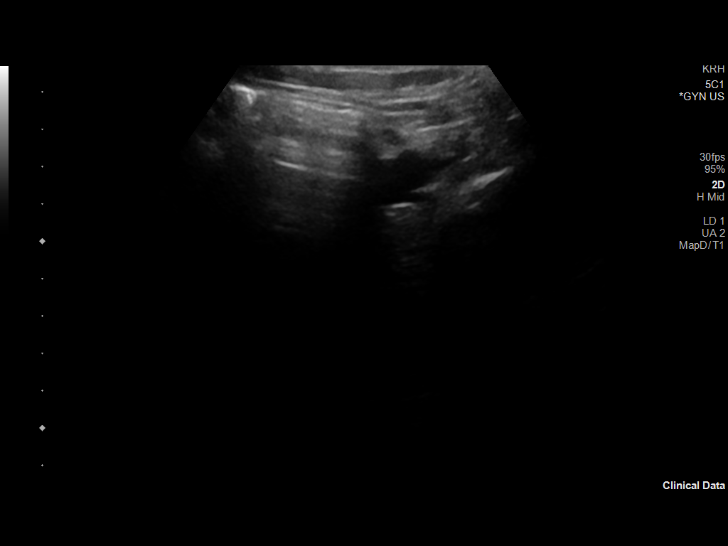
[im 18/67]
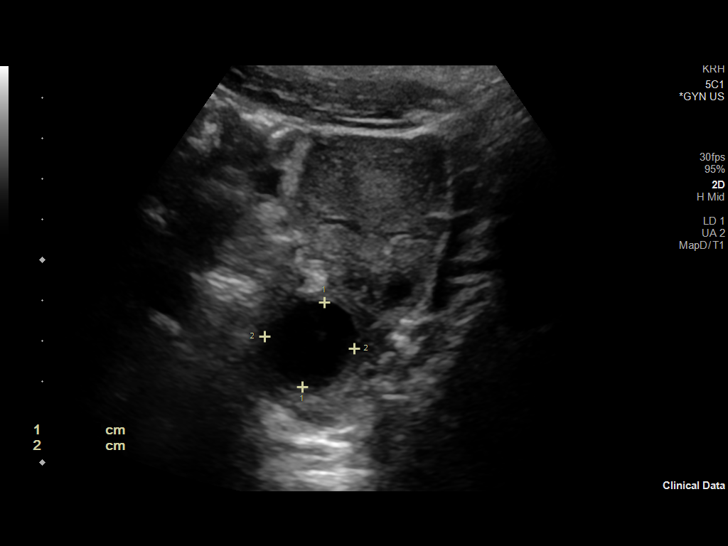
[im 23/67]
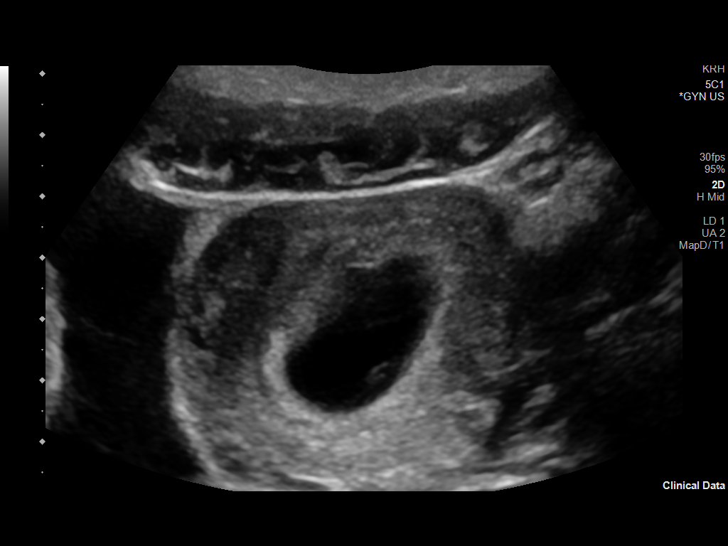
[im 27/67]
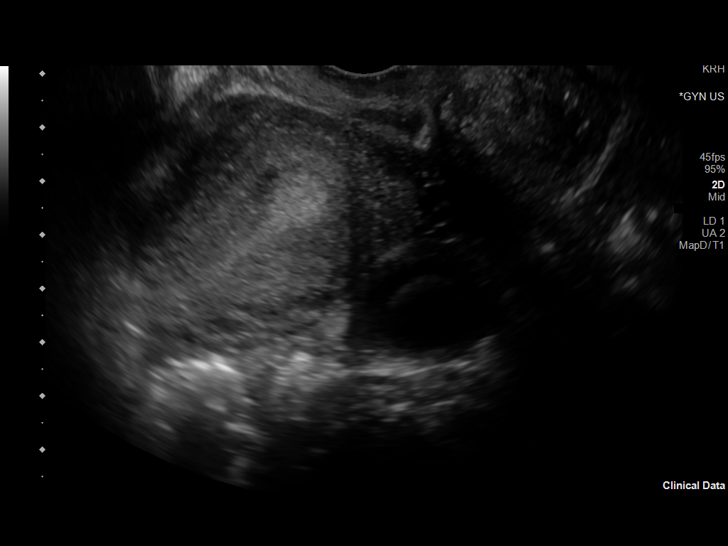
[im 32/67]
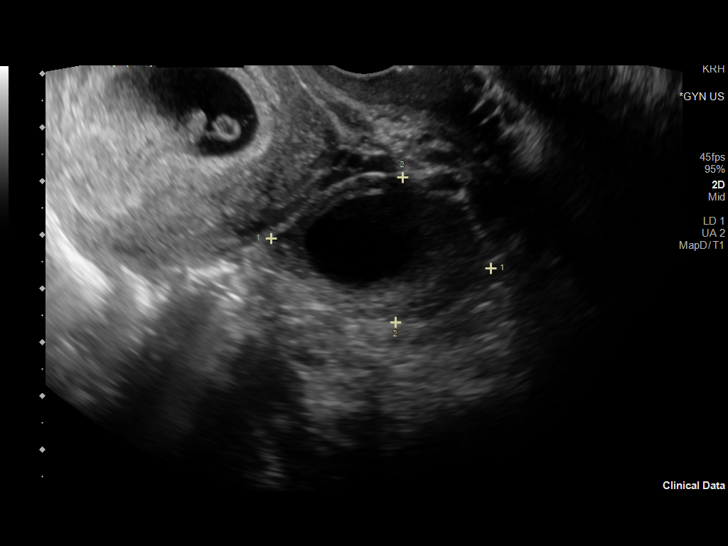
[im 37/67]
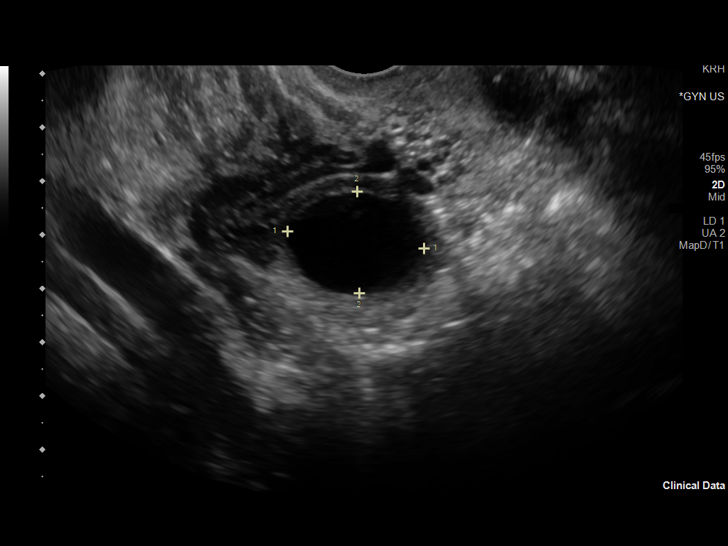
[im 42/67]
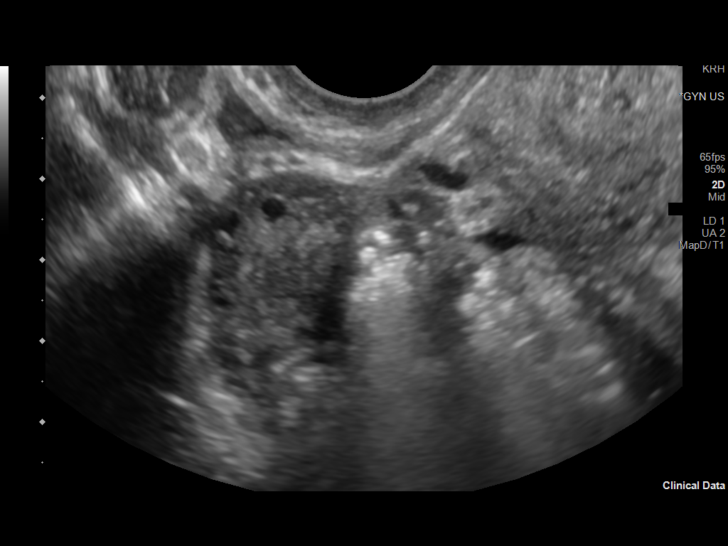
[im 47/67]
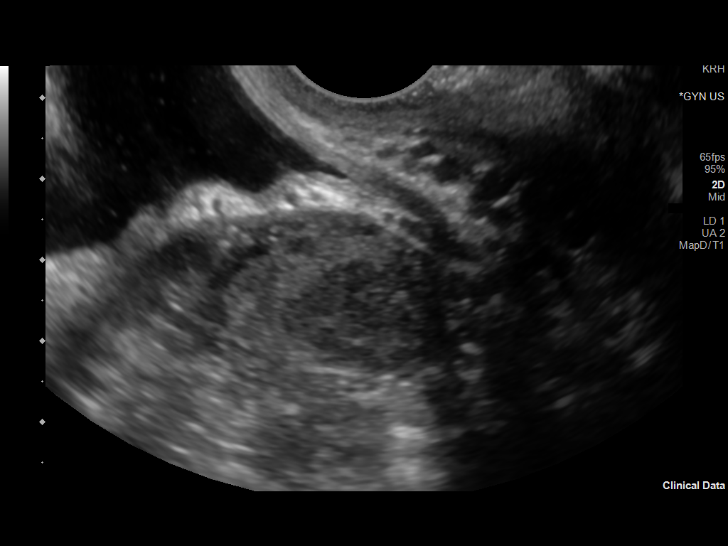
[im 52/67]
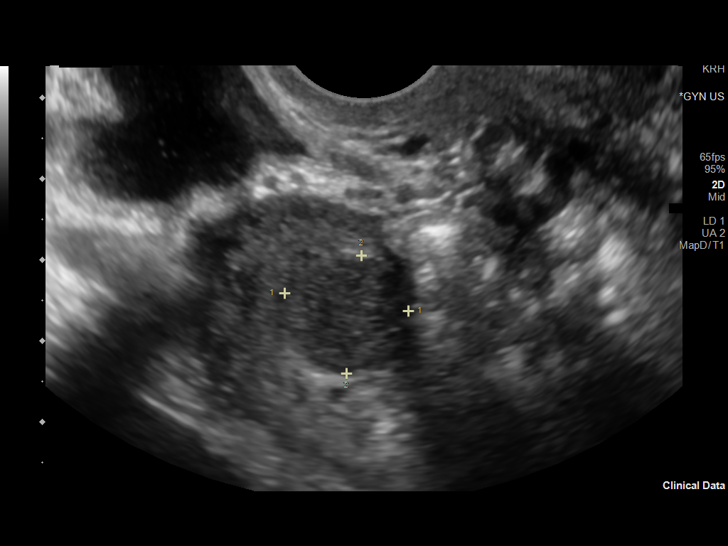
[im 57/67]
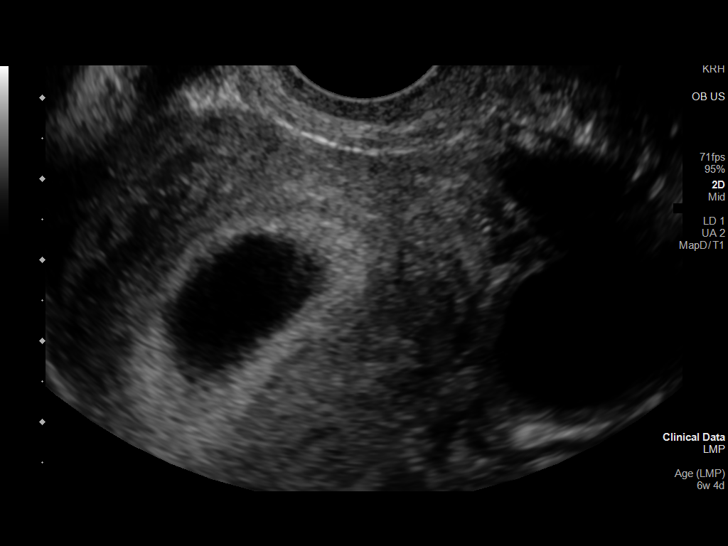
[im 62/67]
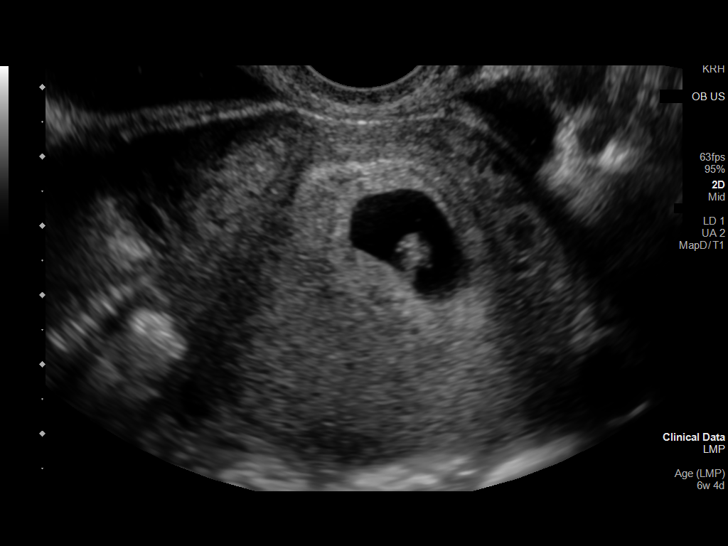
[im 67/67]
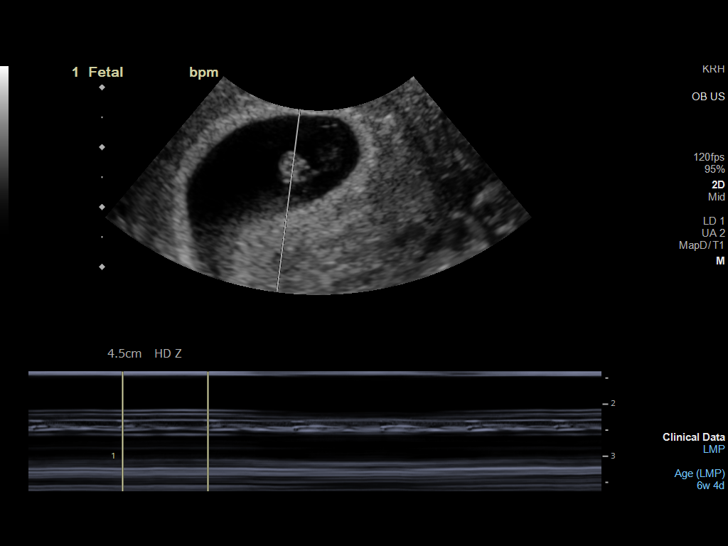

[14 of 28 positions shown; findings below may reference images not displayed]

FINDINGS: Intrauterine gestational sac: Single

Yolk sac:  Visualized

Embryo:  Visualized

Cardiac Activity: Visualized

Heart Rate: 120 bpm

MSD:   mm    w     d

CRL:  7.9 mm   6 w   5 d                  US EDC: 10/09/2019

Subchorionic hemorrhage:  None visualized.

Maternal uterus/adnexae: No adnexal mass or free fluid.
IMPRESSION: Six week 5 day intrauterine pregnancy. Fetal heart rate 120 beats
per minute. No acute maternal findings.

## 2020-10-01 IMAGING — US US ABDOMEN LIMITED
1 series · 14 of 25 positions shown · non-contrast
Comparison: CT dated 11/06/2017.

CLINICAL DATA: Epigastric pain with nausea and vomiting.

EXAM:
ULTRASOUND ABDOMEN LIMITED RIGHT UPPER QUADRANT

[Series 1: us abdomen limited · 14 of 34 slices shown]
[im 1/34]
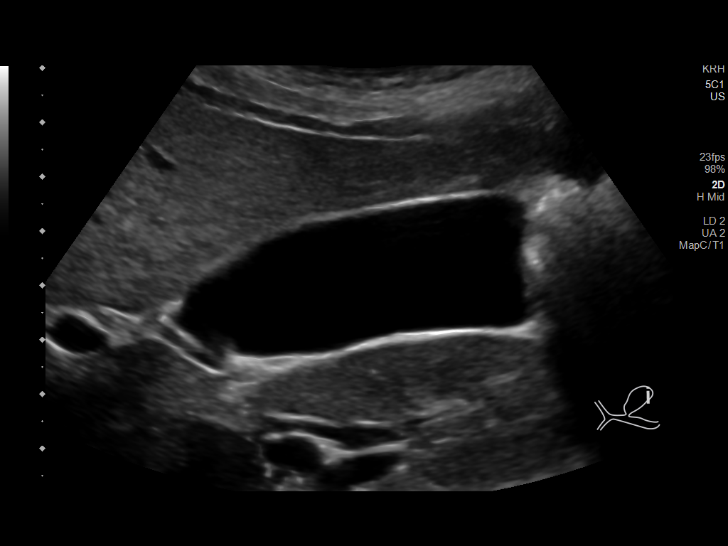
[im 3/34]
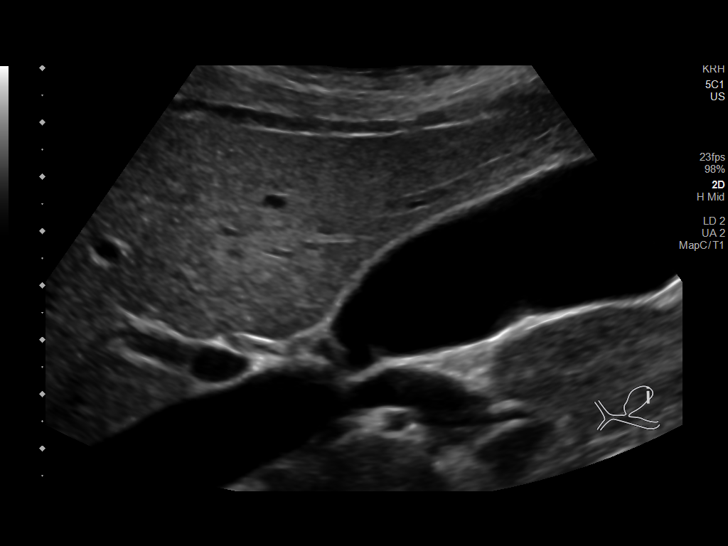
[im 6/34]
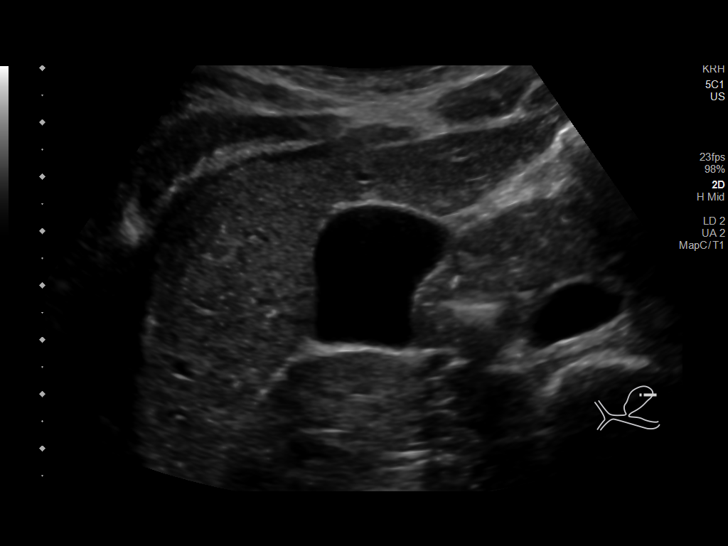
[im 9/34]
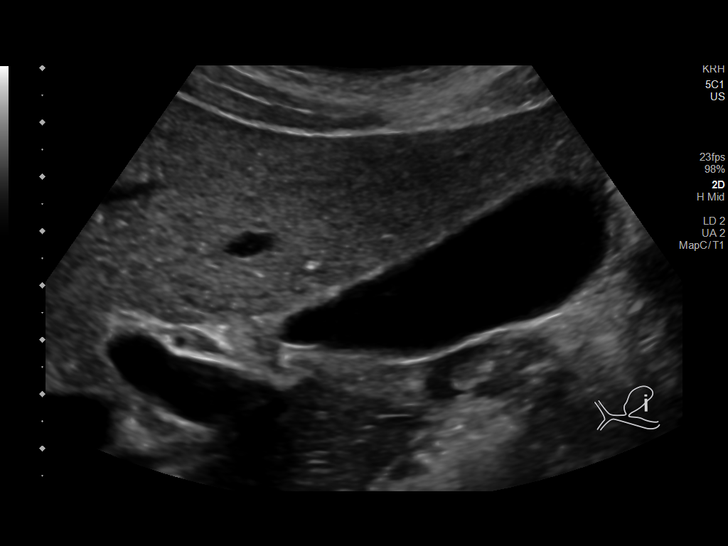
[im 12/34]
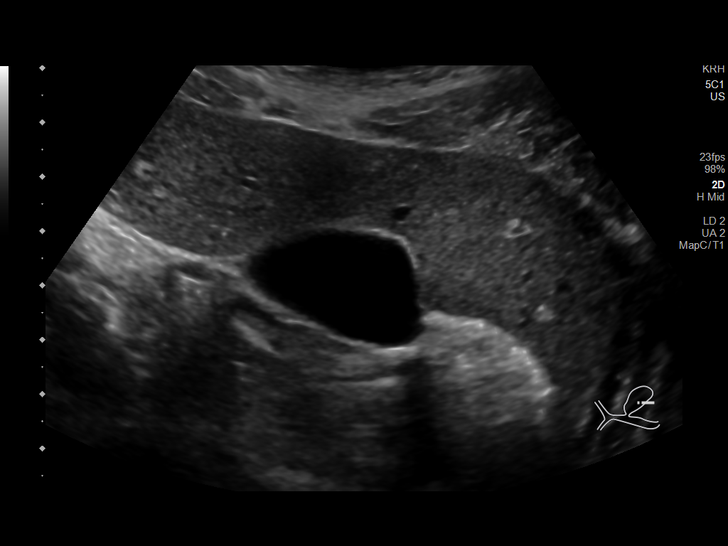
[im 13/34]
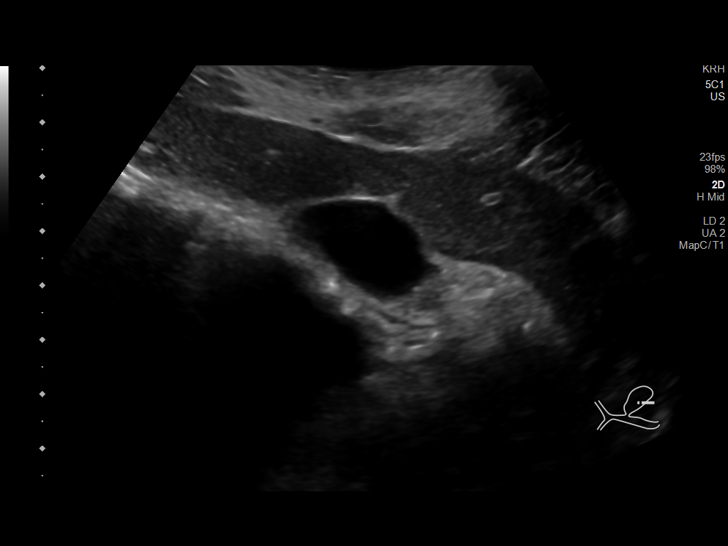
[im 16/34]
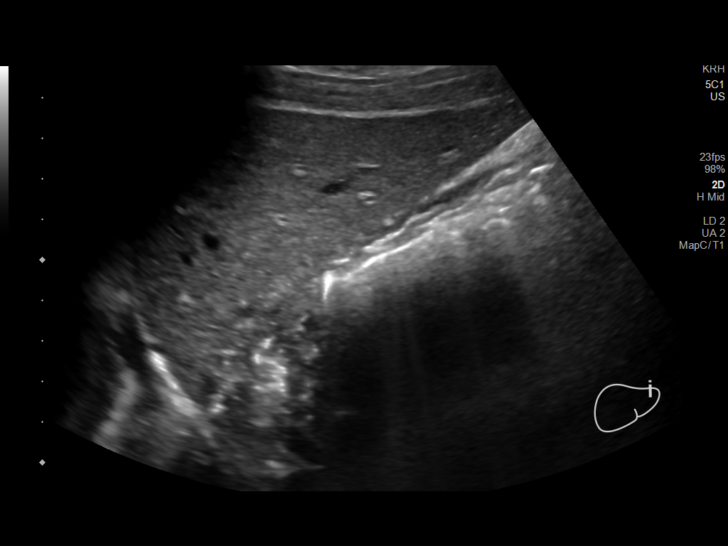
[im 18/34]
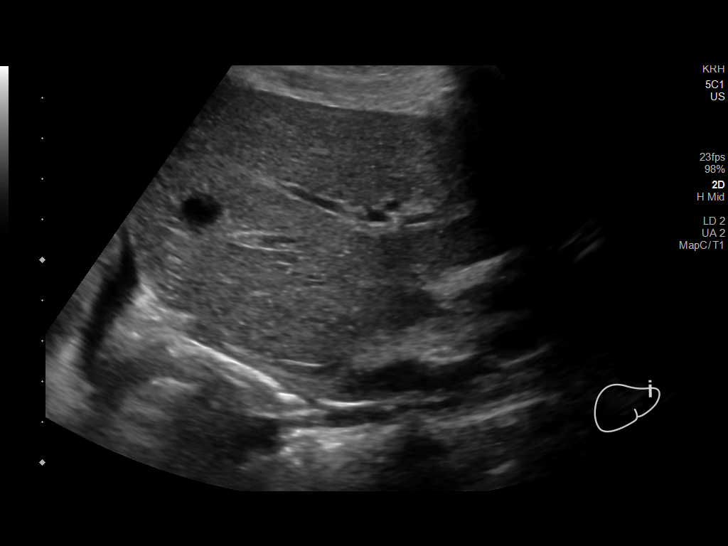
[im 21/34]
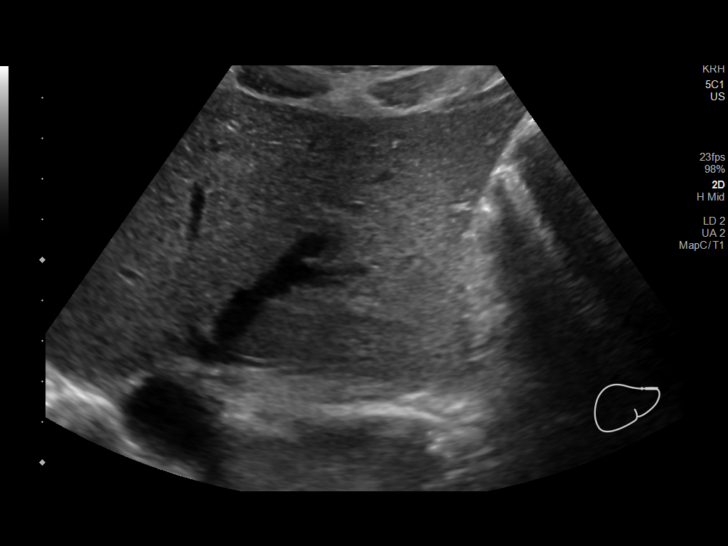
[im 23/34]
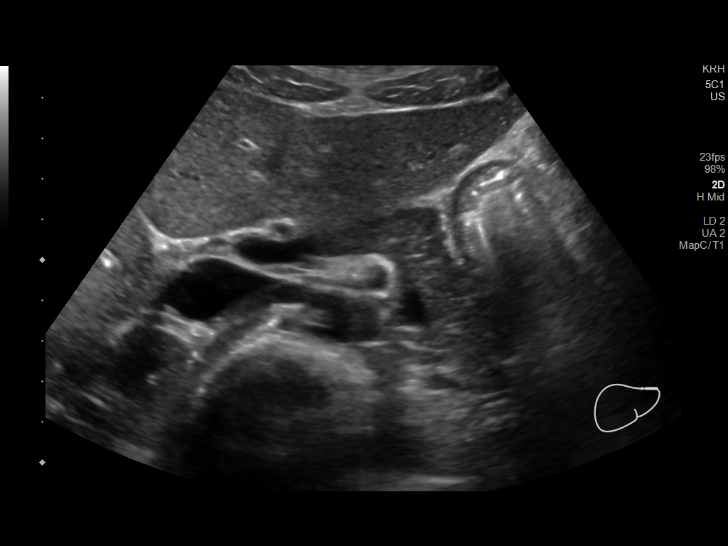
[im 25/34]
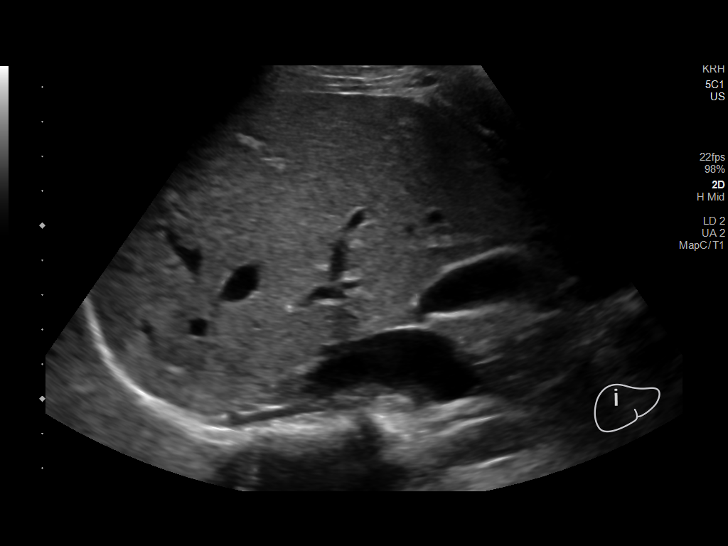
[im 28/34]
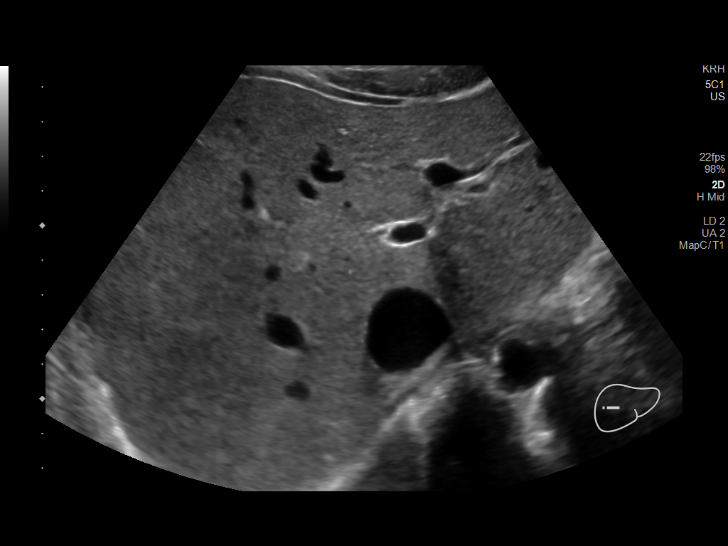
[im 31/34]
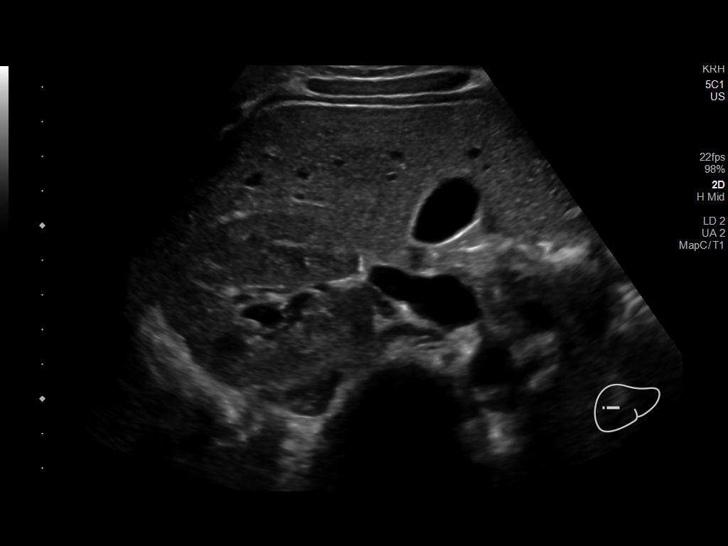
[im 34/34]
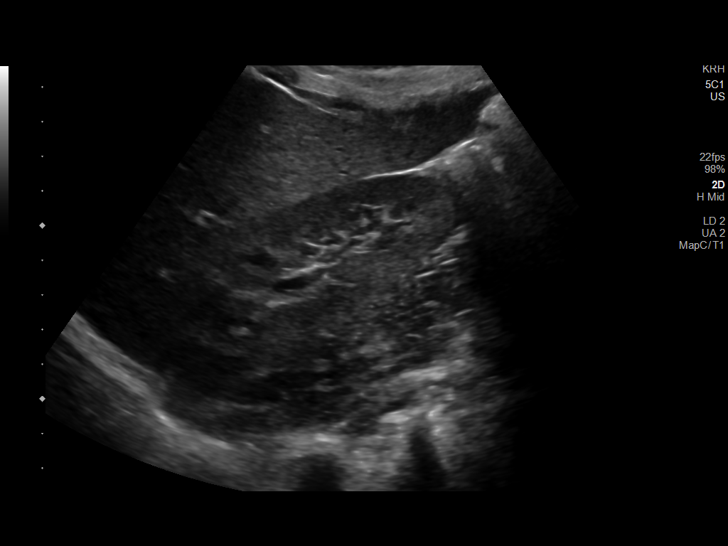

[14 of 25 positions shown; findings below may reference images not displayed]

FINDINGS: Gallbladder:

No gallstones or wall thickening visualized. No sonographic Murphy
sign noted by sonographer.

Common bile duct:

Diameter: 2 mm

Liver:

No focal lesion identified. Within normal limits in parenchymal
echogenicity. Portal vein is patent on color Doppler imaging with
normal direction of blood flow towards the liver.

Other: There is questionable trace abdominal ascites versus
pericardial effusion.
IMPRESSION: 1. No sonographic evidence for cholelithiasis or acute
cholecystitis.
2. Possible trace abdominal ascites versus pericardial effusion.

## 2022-10-12 ENCOUNTER — Ambulatory Visit: Payer: No Typology Code available for payment source | Admitting: Family Medicine

## 2023-09-02 ENCOUNTER — Emergency Department (HOSPITAL_BASED_OUTPATIENT_CLINIC_OR_DEPARTMENT_OTHER)
Admission: EM | Admit: 2023-09-02 | Discharge: 2023-09-02 | Disposition: A | Discharge status: Home / Self Care | Attending: Emergency Medicine | Admitting: Emergency Medicine

## 2023-09-02 ENCOUNTER — Other Ambulatory Visit: Payer: Self-pay

## 2023-09-02 DIAGNOSIS — H66011 Acute suppurative otitis media with spontaneous rupture of ear drum, right ear: Secondary | ICD-10-CM | POA: Insufficient documentation

## 2023-09-02 DIAGNOSIS — H9201 Otalgia, right ear: Secondary | ICD-10-CM | POA: Diagnosis present

## 2023-09-02 MED ORDER — IBUPROFEN 400 MG PO TABS: 400.0000 mg | ORAL_TABLET | Freq: Three times a day (TID) | ORAL | 0 refills | Status: AC | PRN

## 2023-09-02 MED ORDER — AMOXICILLIN 500 MG PO CAPS
1000.0000 mg | ORAL_CAPSULE | Freq: Once | ORAL | Status: AC
  Administered 2023-09-02: 1000 mg via ORAL
  Filled 2023-09-02: qty 2

## 2023-09-02 MED ORDER — AMOXICILLIN 500 MG PO CAPS: 1000.0000 mg | ORAL_CAPSULE | Freq: Three times a day (TID) | ORAL | 0 refills | Status: AC

## 2023-09-02 MED ORDER — HYDROCODONE-ACETAMINOPHEN 5-325 MG PO TABS
1.0000 | ORAL_TABLET | Freq: Once | ORAL | Status: AC
  Administered 2023-09-02: 1 via ORAL
  Filled 2023-09-02: qty 1

## 2023-09-02 NOTE — ED Triage Notes (Addendum)
 Pt c/o being woken from sleep this morning around 3am with intense right ear pain. Pt states her ear feels wet but is unsure if it's tears she has wiped towards it or drainage from her ear. Pt reports cold symptoms and congestion for past few that she has been treating with OTC cold/cough meds, theraflu and dayquil. Pt tearful, holding right ear in triage.

## 2023-09-02 NOTE — ED Provider Notes (Signed)
 [place] EMERGENCY DEPARTMENT AT [facility] Provider Note   CSN: [id number] Arrival date & time: 09/02/23  9341     Patient presents with: Otalgia   Sonya Ewing is a 23 y.o. female.   The history is provided by the patient and a parent.  Otalgia Location:  Right Quality:  Aching Severity:  Severe Onset quality:  Sudden Duration:  4 hours Timing:  Constant Progression:  Worsening Chronicity:  New Context: not direct blow   Relieved by:  Nothing Associated symptoms: congestion and cough   Associated symptoms: no fever    Patient reports recent cough and cold symptoms.  she woke up around 3 AM with significant pain in the right ear and feels like it might be draining.  No bleeding.  No trauma.  No previous history of nephrostomy tubes.  She is otherwise healthy at baseline    Prior to Admission medications   Medication Sig Start Date End Date Taking? Authorizing Provider  amoxicillin  (AMOXIL ) 500 MG capsule Take 2 capsules (1,000 mg total) by mouth 3 (three) times daily. 09/02/23  Yes Midge Golas, MD  ibuprofen  (ADVIL ) 400 MG tablet Take 1 tablet (400 mg total) by mouth every 8 (eight) hours as needed for moderate pain (pain score 4-6). 09/02/23  Yes Midge Golas, MD    Allergies: Patient has no known allergies.    Review of Systems  Constitutional:  Negative for fever.  HENT:  Positive for congestion and ear pain.   Respiratory:  Positive for cough.     Updated Vital Signs BP 123/87 (BP Location: Left Arm)   Pulse 68   Temp 98.4 F (36.9 C) (Oral)   LMP 08/13/2023 (Approximate)   SpO2 100%   Physical Exam CONSTITUTIONAL: Well developed/well nourished, anxious HEAD: Normocephalic/atraumatic EYES: EOMI/PERRL ENMT: Mucous membranes moist, ears are symmetric.  Left TM is clear and intact.  Right TM is erythematous and distorted but no obvious rupture is noted.  No bleeding is noted No stridor or drooling.  No overlying erythema noted to  either ear NECK: supple no meningeal signs NEURO: Pt is awake/alert/appropriate, moves all extremitiesx4.  No facial droop.   SKIN: warm, color normal  (all labs ordered are listed, but only abnormal results are displayed) Labs Reviewed - No data to display  EKG: None  Radiology: No results found.   Procedures   Medications Ordered in the ED  amoxicillin  (AMOXIL ) capsule 1,000 mg (1,000 mg Oral Given 09/02/23 0727)  HYDROcodone -acetaminophen  (NORCO/VICODIN) 5-325 MG per tablet 1 tablet (1 tablet Oral Given 09/02/23 0726)                                    Medical Decision Making Risk Prescription drug management.   Patient with recent symptoms of upper respiratory infection.  Now here with intense right ear pain and likely drainage. Although I do not see a definitive rupture membrane, I suspect that is the cause of her symptoms. Patient will be started on oral antibiotics and referred to otolaryngology     Final diagnoses:  Non-recurrent acute suppurative otitis media of right ear with spontaneous rupture of tympanic membrane    ED Discharge Orders          Ordered    amoxicillin  (AMOXIL ) 500 MG capsule  3 times daily        09/02/23 0726    ibuprofen  (ADVIL ) 400 MG tablet  Every 8  hours PRN        09/02/23 9273               Midge Golas, MD 09/02/23 0730
